# Patient Record
Sex: Female | Born: 2000 | Hispanic: Yes | Marital: Married | Smoking: Never smoker
Health system: Southern US, Community
[De-identification: ages and names within clinical notes are randomized; demographics above are authoritative.]

## PROBLEM LIST (undated history)

## (undated) DIAGNOSIS — D649 Anemia, unspecified: Secondary | ICD-10-CM

## (undated) DIAGNOSIS — R519 Headache, unspecified: Secondary | ICD-10-CM

## (undated) DIAGNOSIS — F419 Anxiety disorder, unspecified: Secondary | ICD-10-CM

## (undated) HISTORY — DX: Anxiety disorder, unspecified: F41.9

## (undated) HISTORY — DX: Headache, unspecified: R51.9

---
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
## 2018-01-02 ENCOUNTER — Ambulatory Visit: Payer: Medicaid Other

## 2018-01-02 ENCOUNTER — Ambulatory Visit: Payer: Self-pay

## 2018-01-04 ENCOUNTER — Ambulatory Visit: Payer: Self-pay

## 2018-01-04 ENCOUNTER — Ambulatory Visit: Payer: Medicaid Other | Attending: Diagnostic Neuroimaging

## 2018-01-04 DIAGNOSIS — M545 Low back pain: Secondary | ICD-10-CM | POA: Diagnosis present

## 2018-01-04 DIAGNOSIS — G8929 Other chronic pain: Secondary | ICD-10-CM | POA: Diagnosis present

## 2018-01-04 DIAGNOSIS — M62830 Muscle spasm of back: Secondary | ICD-10-CM | POA: Diagnosis present

## 2018-01-04 NOTE — Patient Instructions (Signed)
Issued cat /camel, prayer stretch also with side stretching , 30-45 sec 2-3x/day 2-3 reps, clams x 10-20reps   2x/day

## 2018-01-04 NOTE — Therapy (Addendum)
[place], [place], [address] Phone: [phone]   Fax:  [phone]  Physical Therapy Treatment/ Discharge   Patient Details  Name: Deborah Hunter MRN: [id number] Date of Birth: Dec 02, 2000 Referring Provider: DFr Andrey Spearman    Encounter Date: 01/04/2018  PT End of Session - 01/04/18 0710    Visit Number  2    Number of Visits  16    Date for PT Re-Evaluation  02/12/18    Authorization Type  Mediciad     Authorization Time Period  ends 02/25/18    Authorization - Number of Visits  16    PT Start Time  0710 late 10 min    PT Stop Time  0750    PT Time Calculation (min)  40 min    Behavior During Therapy  [facility] for tasks assessed/performed       Past Medical History:  Diagnosis Date  . Anemia   . Anxiety     No past surgical history on file.  There were no vitals filed for this visit.  Subjective Assessment - 01/04/18 0711    Subjective  No better since eval.  She reports HEP 2x/day.      Patient Stated Goals  decrease pain for longer periods of time    Currently in Pain?  Yes    Pain Score  7     Pain Location  Back    Pain Orientation  Mid    Pain Descriptors / Indicators  Aching    Pain Type  Chronic pain    Pain Onset  More than a month ago    Pain Frequency  Constant    Aggravating Factors   worse later in  day    Pain Relieving Factors  stretch    Multiple Pain Sites  No                      [facility] Adult PT Treatment/Exercise - 01/04/18 0001      Lumbar Exercises: Stretches   Active Hamstring Stretch  Right;Left;1 rep;30 seconds    Active Hamstring Stretch Limitations  with strap supine    Other Lumbar Stretch Exercise  piriformis stretchx 2 45 sec      Lumbar Exercises: Supine   Pelvic Tilt Limitations  10 reps 5 sec hold    Bent Knee Raise Limitations  supine bent knee raise 2x10     Bridge  10 reps      Lumbar Exercises: Sidelying   Clam  Right;Left     Clam Limitations  12 reps each      Lumbar Exercises: Quadruped   Madcat/Old Horse  10 reps    Madcat/Old Horse Limitations  followed by prayer stretch x 30 sec and RT/LT Ql stretch       Modalities   Modalities  Moist Heat      Moist Heat Therapy   Number Minutes Moist Heat  -- during session    Moist Heat Location  Lumbar Spine      Manual Therapy   Manual Therapy  Joint mobilization;Soft tissue mobilization    Joint Mobilization  Gr 2-3 to T5-L5     Soft tissue mobilization  to paraspinals an dQL             PT Education - 01/04/18 0750    Education provided  Yes    Education Details  HEP also opiton to hold pole and flex  to get  stretch /traction    Person(s) Educated  Patient    Methods  Explanation;Demonstration;Tactile cues;Verbal cues;Handout    Comprehension  Verbalized understanding;Returned demonstration       PT Short Term Goals - 12/18/17 1100      PT SHORT TERM GOAL #1   Title  Patient will demostrate a good core contraction     Baseline  moderate cuing for core contraction     Time  4    Status  New    Target Date  01/15/18      PT SHORT TERM GOAL #2   Title  Patient will increase gross bilateral lower extremity strength to 4+/5     Baseline  4/5 bilateral hip abduction and flexion     Time  4    Period  Weeks    Status  New    Target Date  01/15/18      PT SHORT TERM GOAL #3   Title  Patient will be independent with HEP     Baseline  no HEP     Time  4    Period  Weeks    Status  New    Target Date  01/15/18        PT Long Term Goals - 12/18/17 1103      PT LONG TERM GOAL #1   Title  Patient will squat down to pick up item without pain in order to perfrom work tasks     Baseline  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     Time  8    Period  Weeks    Status  New    Target Date  02/12/18      PT LONG TERM GOAL #2   Title  Patient will report 2/10 pain at worst when standing in order to perfrom ADl's      Baseline  Pain when standing more then 10-15 minutes     Time  8    Period  Weeks    Status  New    Target Date  02/12/18      PT LONG TERM GOAL #3   Title  Patient will sit for 1 hour without pain     Baseline  Can not sit more then 10-25 minutes without pain     Time  8    Period  Weeks    Status  New    Target Date  02/12/18            Plan - 01/04/18 0711    Clinical Impression Statement  She reported feeling better post session and agreed to HEP.   She was able to do all HEp correctly today.   No overall improvement    PT Treatment/Interventions  ADLs/Self Care Home Management;Cryotherapy;Electrical Stimulation;Iontophoresis 16m/ml Dexamethasone;Ultrasound;Therapeutic exercise;Therapeutic activities;Neuromuscular re-education;Patient/family education;Manual techniques;Taping;Passive range of motion    PT Next Visit Plan  manual therapy; review HEP; consdier adding bridges; maybe alt ue/LE if she can tolerate; hip abduction/ adduction    PT Home Exercise Plan  seated hamstring stretch; posterior pelvic tilt; tennis ball trigger posint release    Consulted and Agree with Plan of Care  Patient       Patient will benefit from skilled therapeutic intervention in order to improve the following deficits and impairments:  Pain, Increased muscle spasms, Decreased strength, Decreased range of motion, Decreased activity tolerance, Decreased endurance, Postural dysfunction  Visit Diagnosis: Chronic bilateral low back pain without sciatica  Muscle spasm  of back  PHYSICAL THERAPY DISCHARGE SUMMARY  Visits from Start of Care: 2 Current functional level related to goals / functional outcomes: Unknown    Remaining deficits: Unknown    Education / Equipment:HEP   Plan: Patient agrees to discharge.  Patient goals were not met. Patient is being discharged due to meeting the stated rehab goals.  ?????        Problem List There are no active problems to display for this  patient.  Burnis Medin PT DPT 03/19/2018  Darrel Hoover  PT 01/04/2018, 7:53 AM  [facility] [address] [place], [place], [address] Phone: [phone]   Fax:  [phone]  Name: Darline Faith MRN: [id number] Date of Birth: 07-Jun-2001

## 2018-01-09 ENCOUNTER — Ambulatory Visit: Payer: Self-pay

## 2018-01-09 ENCOUNTER — Ambulatory Visit: Payer: Medicaid Other

## 2018-01-11 ENCOUNTER — Ambulatory Visit: Payer: Medicaid Other

## 2018-01-11 ENCOUNTER — Ambulatory Visit: Payer: Self-pay

## 2018-01-15 ENCOUNTER — Ambulatory Visit: Payer: Self-pay

## 2018-01-15 ENCOUNTER — Ambulatory Visit: Payer: Medicaid Other

## 2018-01-16 ENCOUNTER — Telehealth: Payer: Self-pay | Admitting: Physical Therapy

## 2018-01-16 ENCOUNTER — Ambulatory Visit: Payer: Self-pay

## 2018-01-16 ENCOUNTER — Ambulatory Visit: Payer: Medicaid Other

## 2018-01-16 NOTE — Telephone Encounter (Signed)
Message left about missed appointments for next week and she needed to call to confirm or cancel these appointments

## 2018-01-23 ENCOUNTER — Ambulatory Visit: Payer: Self-pay

## 2018-01-25 ENCOUNTER — Ambulatory Visit: Payer: Self-pay

## 2018-02-12 ENCOUNTER — Ambulatory Visit: Payer: Medicaid Other | Admitting: Adult Health

## 2018-02-26 ENCOUNTER — Ambulatory Visit: Payer: Medicaid Other

## 2018-03-19 ENCOUNTER — Encounter: Payer: Self-pay | Admitting: Physical Therapy
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
## 2019-04-15 ENCOUNTER — Telehealth: Payer: Self-pay | Admitting: Diagnostic Neuroimaging

## 2019-04-15 NOTE — Telephone Encounter (Signed)
Pt consented to a virtual visit. Link sent to email in chart.  Pt understands that although there may be some limitations with this type of visit, we will take all precautions to reduce any security or privacy concerns.  Pt understands that this will be treated like an in office visit and we will file with pt's insurance, and there may be a patient responsible charge related to this service.

## 2019-04-18 ENCOUNTER — Encounter: Payer: Self-pay | Admitting: Diagnostic Neuroimaging

## 2019-04-18 NOTE — Telephone Encounter (Signed)
Spoke with patient and updated EMR. 

## 2019-04-22 ENCOUNTER — Other Ambulatory Visit: Payer: Self-pay

## 2019-04-22 ENCOUNTER — Encounter: Payer: Self-pay | Admitting: Diagnostic Neuroimaging

## 2019-04-22 ENCOUNTER — Ambulatory Visit (INDEPENDENT_AMBULATORY_CARE_PROVIDER_SITE_OTHER): Payer: Self-pay | Admitting: Diagnostic Neuroimaging

## 2019-04-22 DIAGNOSIS — G43109 Migraine with aura, not intractable, without status migrainosus: Secondary | ICD-10-CM

## 2019-04-22 DIAGNOSIS — F0781 Postconcussional syndrome: Secondary | ICD-10-CM

## 2019-04-22 MED ORDER — AMITRIPTYLINE HCL 10 MG PO TABS: 10.0000 mg | ORAL_TABLET | Freq: Every day | ORAL | 3 refills | Status: DC
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
## 2019-05-07 ENCOUNTER — Telehealth: Payer: Medicaid Other | Admitting: Family

## 2019-05-07 DIAGNOSIS — J069 Acute upper respiratory infection, unspecified: Secondary | ICD-10-CM | POA: Diagnosis not present

## 2019-05-07 MED ORDER — FLUTICASONE PROPIONATE 50 MCG/ACT NA SUSP: 2.0000 | Freq: Every day | NASAL | 0 refills | Status: AC

## 2019-05-07 MED ORDER — BENZONATATE 200 MG PO CAPS: 200.0000 mg | ORAL_CAPSULE | Freq: Two times a day (BID) | ORAL | 0 refills | Status: AC | PRN

## 2019-05-07 NOTE — Progress Notes (Signed)
We are sorry you are not feeling well.  Here is how we plan to help!  Based on what you have shared with me, it looks like you may have a viral upper respiratory infection.  Upper respiratory infections are caused by a large number of viruses; however, rhinovirus is the most common cause.   Symptoms vary from person to person, with common symptoms including sore throat, cough, and fatigue or lack of energy.  A low-grade fever of up to 100.4 may present, but is often uncommon.  Symptoms vary however, and are closely related to a person's age or underlying illnesses.  The most common symptoms associated with an upper respiratory infection are nasal discharge or congestion, cough, sneezing, headache and pressure in the ears and face.  These symptoms usually persist for about 3 to 10 days, but can last up to 2 weeks.  It is important to know that upper respiratory infections do not cause serious illness or complications in most cases.    Upper respiratory infections can be transmitted from person to person, with the most common method of transmission being a person's hands.  The virus is able to live on the skin and can infect other persons for up to 2 hours after direct contact.  Also, these can be transmitted when someone coughs or sneezes; thus, it is important to cover the mouth to reduce this risk.  To keep the spread of the illness at bay, good hand hygiene is very important.  This is an infection that is most likely caused by a virus. There are no specific treatments other than to help you with the symptoms until the infection runs its course.  We are sorry you are not feeling well.  Here is how we plan to help!   For nasal congestion, you may use an oral decongestants such as Mucinex D or if you have glaucoma or high blood pressure use plain Mucinex.  Saline nasal spray or nasal drops can help and can safely be used as often as needed for congestion.  For your congestion, I have prescribed Fluticasone  nasal spray one spray in each nostril twice a day  If you do not have a history of heart disease, hypertension, diabetes or thyroid disease, prostate/bladder issues or glaucoma, you may also use Sudafed to treat nasal congestion.  It is highly recommended that you consult with a pharmacist or your primary care physician to ensure this medication is safe for you to take.     If you have a cough, you may use cough suppressants such as Delsym and Robitussin.  If you have glaucoma or high blood pressure, you can also use Coricidin HBP.   For cough I have prescribed for you A prescription cough medication called Tessalon Perles 100 mg. You may take 1-2 capsules every 8 hours as needed for cough  If you have a sore or scratchy throat, use a saltwater gargle-  to  teaspoon of salt dissolved in a 4-ounce to 8-ounce glass of warm water.  Gargle the solution for approximately 15-30 seconds and then spit.  It is important not to swallow the solution.  You can also use throat lozenges/cough drops and Chloraseptic spray to help with throat pain or discomfort.  Warm or cold liquids can also be helpful in relieving throat pain.  For headache, pain or general discomfort, you can use Ibuprofen or Tylenol as directed.   Some authorities believe that zinc sprays or the use of Echinacea may shorten the   course of your symptoms.   HOME CARE . Only take medications as instructed by your medical team. . Be sure to drink plenty of fluids. Water is fine as well as fruit juices, sodas and electrolyte beverages. You may want to stay away from caffeine or alcohol. If you are nauseated, try taking small sips of liquids. How do you know if you are getting enough fluid? Your urine should be a pale yellow or almost colorless. . Get rest. . Taking a steamy shower or using a humidifier may help nasal congestion and ease sore throat pain. You can place a towel over your head and breathe in the steam from hot water coming from a  faucet. . Using a saline nasal spray works much the same way. . Cough drops, hard candies and sore throat lozenges may ease your cough. . Avoid close contacts especially the very young and the elderly . Cover your mouth if you cough or sneeze . Always remember to wash your hands.   GET HELP RIGHT AWAY IF: . You develop worsening fever. . If your symptoms do not improve within 10 days . You develop yellow or green discharge from your nose over 3 days. . You have coughing fits . You develop a severe head ache or visual changes. . You develop shortness of breath, difficulty breathing or start having chest pain . Your symptoms persist after you have completed your treatment plan  MAKE SURE YOU   Understand these instructions.  Will watch your condition.  Will get help right away if you are not doing well or get worse.  Your e-visit answers were reviewed by a board certified advanced clinical practitioner to complete your personal care plan. Depending upon the condition, your plan could have included both over the counter or prescription medications. Please review your pharmacy choice. If there is a problem, you may call our nursing hot line at and have the prescription routed to another pharmacy. Your safety is important to us. If you have drug allergies check your prescription carefully.   You can use MyChart to ask questions about today's visit, request a non-urgent call back, or ask for a work or school excuse for 24 hours related to this e-Visit. If it has been greater than 24 hours you will need to follow up with your provider, or enter a new e-Visit to address those concerns. You will get an e-mail in the next two days asking about your experience.  I hope that your e-visit has been valuable and will speed your recovery. Thank you for using e-visits.    Greater than 5 minutes, yet less than 10 minutes of time have been spent researching, coordinating, and implementing care for this  patient today.  Thank you for the details you included in the comment boxes. Those details are very helpful in determining the best course of treatment for you and help us to provide the best care.   

## 2020-05-06 ENCOUNTER — Ambulatory Visit: Payer: Medicaid Other

## 2020-05-06 ENCOUNTER — Ambulatory Visit: Payer: Medicaid Other | Attending: Internal Medicine

## 2020-05-06 DIAGNOSIS — Z20822 Contact with and (suspected) exposure to covid-19: Secondary | ICD-10-CM | POA: Insufficient documentation

## 2020-05-07 LAB — SARS-COV-2, NAA 2 DAY TAT

## 2020-05-07 LAB — NOVEL CORONAVIRUS, NAA: SARS-CoV-2, NAA: NOT DETECTED

## 2020-05-25 ENCOUNTER — Ambulatory Visit: Payer: Medicaid Other | Attending: Internal Medicine

## 2020-05-25 DIAGNOSIS — Z23 Encounter for immunization: Secondary | ICD-10-CM

## 2020-05-25 NOTE — Progress Notes (Signed)
   Covid-19 Vaccination Clinic  Name:  Sharita Bienaime    MRN: [id number] DOB: 2001-01-27  05/25/2020  Ms. Boys was observed post Covid-19 immunization for 15 minutes without incident. She was provided with Vaccine Information Sheet and instruction to access the V-Safe system.   Ms. Hirt was instructed to call 911 with any severe reactions post vaccine: Marland Kitchen Difficulty breathing  . Swelling of face and throat  . A fast heartbeat  . A bad rash all over body  . Dizziness and weakness   Immunizations Administered    Name Date Dose VIS Date Route   Pfizer COVID-19 Vaccine 05/25/2020  4:28 PM 0.3 mL 12/25/2018 Intramuscular   Manufacturer: [employer], [employer]   Lot: [id number]   NDC: [address]-1

## 2020-06-30 ENCOUNTER — Ambulatory Visit: Payer: Medicaid Other

## 2020-07-07 ENCOUNTER — Ambulatory Visit: Payer: Medicaid Other | Attending: Internal Medicine

## 2020-07-07 DIAGNOSIS — Z23 Encounter for immunization: Secondary | ICD-10-CM

## 2020-07-07 NOTE — Progress Notes (Signed)
   Covid-19 Vaccination Clinic  Name:  Laquita Harlan    MRN: [id number] DOB: 2001-07-29  07/07/2020  Ms. Azar was observed post Covid-19 immunization for 15 minutes without incident. She was provided with Vaccine Information Sheet and instruction to access the V-Safe system.   Ms. Sinkler was instructed to call 911 with any severe reactions post vaccine: Marland Kitchen Difficulty breathing  . Swelling of face and throat  . A fast heartbeat  . A bad rash all over body  . Dizziness and weakness   Immunizations Administered    Name Date Dose VIS Date Route   Pfizer COVID-19 Vaccine 07/07/2020  3:48 PM 0.3 mL 12/25/2018 Intramuscular   Manufacturer: [employer], [employer]   Lot: [id number]   NDC: [id number]

## 2021-06-28 DIAGNOSIS — R87620 Atypical squamous cells of undetermined significance on cytologic smear of vagina (ASC-US): Secondary | ICD-10-CM | POA: Diagnosis not present

## 2021-06-28 DIAGNOSIS — Z01411 Encounter for gynecological examination (general) (routine) with abnormal findings: Secondary | ICD-10-CM | POA: Diagnosis not present

## 2021-06-28 DIAGNOSIS — N912 Amenorrhea, unspecified: Secondary | ICD-10-CM | POA: Diagnosis not present

## 2021-06-28 DIAGNOSIS — N941 Unspecified dyspareunia: Secondary | ICD-10-CM | POA: Diagnosis not present

## 2021-06-28 DIAGNOSIS — Z975 Presence of (intrauterine) contraceptive device: Secondary | ICD-10-CM | POA: Diagnosis not present

## 2021-06-28 DIAGNOSIS — Z0000 Encounter for general adult medical examination without abnormal findings: Secondary | ICD-10-CM | POA: Diagnosis not present

## 2021-06-28 DIAGNOSIS — Z1272 Encounter for screening for malignant neoplasm of vagina: Secondary | ICD-10-CM | POA: Diagnosis not present

## 2021-07-08 ENCOUNTER — Telehealth (HOSPITAL_COMMUNITY): Payer: Self-pay | Admitting: Psychiatry

## 2021-07-08 ENCOUNTER — Other Ambulatory Visit: Payer: Self-pay

## 2021-07-08 ENCOUNTER — Ambulatory Visit (INDEPENDENT_AMBULATORY_CARE_PROVIDER_SITE_OTHER): Payer: No Payment, Other | Admitting: Psychiatry

## 2021-07-08 ENCOUNTER — Encounter (HOSPITAL_COMMUNITY): Payer: Self-pay | Admitting: Psychiatry

## 2021-07-08 DIAGNOSIS — F3181 Bipolar II disorder: Secondary | ICD-10-CM | POA: Diagnosis not present

## 2021-07-08 DIAGNOSIS — F411 Generalized anxiety disorder: Secondary | ICD-10-CM | POA: Diagnosis not present

## 2021-07-08 MED ORDER — ARIPIPRAZOLE 5 MG PO TABS
5.0000 mg | ORAL_TABLET | Freq: Every day | ORAL | 3 refills | Status: DC
  Filled 2021-07-08: qty 30, 30d supply, fill #0

## 2021-07-08 MED ORDER — SERTRALINE HCL 25 MG PO TABS
25.0000 mg | ORAL_TABLET | Freq: Every day | ORAL | 3 refills | Status: DC
  Filled 2021-07-08: qty 30, 30d supply, fill #0

## 2021-07-08 NOTE — Telephone Encounter (Signed)
Thank you for returning this call Ms. Beck.  If patient has further concerns she can call the clinic for instruction.
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
## 2021-07-08 NOTE — Telephone Encounter (Signed)
Patient asking if she needs to take medications prescribed today, with food or at night or any other instructions.  Call [phone]

## 2021-07-14 ENCOUNTER — Other Ambulatory Visit: Payer: Self-pay

## 2021-10-12 ENCOUNTER — Telehealth (INDEPENDENT_AMBULATORY_CARE_PROVIDER_SITE_OTHER): Payer: No Payment, Other | Admitting: Psychiatry

## 2021-10-12 ENCOUNTER — Other Ambulatory Visit: Payer: Self-pay

## 2021-10-12 ENCOUNTER — Encounter (HOSPITAL_COMMUNITY): Payer: Self-pay | Admitting: Psychiatry

## 2021-10-12 DIAGNOSIS — F3181 Bipolar II disorder: Secondary | ICD-10-CM

## 2021-10-12 DIAGNOSIS — F411 Generalized anxiety disorder: Secondary | ICD-10-CM

## 2021-10-12 MED ORDER — SERTRALINE HCL 25 MG PO TABS
25.0000 mg | ORAL_TABLET | Freq: Every day | ORAL | 3 refills | Status: AC
  Filled 2021-10-12: qty 30, 30d supply, fill #0

## 2021-10-12 MED ORDER — ARIPIPRAZOLE 5 MG PO TABS
5.0000 mg | ORAL_TABLET | Freq: Every day | ORAL | 3 refills | Status: AC
  Filled 2021-10-12: qty 30, 30d supply, fill #0

## 2021-10-12 NOTE — Progress Notes (Signed)
BH MD/PA/NP OP Progress Note Virtual Visit via Telephone Note  I connected with Deborah Hunter on 10/12/21 at  4:00 PM EST by telephone and verified that I am speaking with the correct person using two identifiers.  Location: Patient: home Provider: Clinic   I discussed the limitations, risks, security and privacy concerns of performing an evaluation and management service by telephone and the availability of in person appointments. I also discussed with the patient that there may be a patient responsible charge related to this service. The patient expressed understanding and agreed to proceed.   I provided 30 minutes of non-face-to-face time during this encounter.  10/12/2021 10:57 AM Deborah Hunter  MRN:  [id number]  Chief Complaint: "I never started the medication but I have been having a depressive episodes"  HPI: 20 year old female seen today for follow up psychiatric evaluation.   She has a psychiatric history of Bipolar 2, PTSD, anxiety, and depression.  Currently she is managed on Abilify 5 mg daily and Zoloft 25 mg daily. She notes she never started her medications due to fear of side effects.   Today patient was unable to logon virtually so assessment was done over the phone.  During exam she was pleasant, cooperative, and engaged in conversation.  She informed Clinical research associate that since her last visit she has been having a depressive episode. She notes that this episode has been going on for a month. Patient notes that she is irritable, distractible, and reports having racing thoughts. Patient also notes that her sleep has been reduced noting that she sleeps (4-6 hours). She denies other symptoms of mania.   Patient also notes that she is anxious most days. She reports that she worries about her finances, work, her family, and how people perceive her. A times she notes that she is paranoid.  Today provider conducted a GAD-7 and patient scored a 16, at her last visit patient  scored a 16. Provider also conducted a PHQ-9 and patient scored a 21, at her last visit she score a 16. She endorses passive SI but denies wanting to harm herself today. She denies SI/HI.    She continues to attends [employer] and studies to [employer]. 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   Today she is agreeable to starting Abilify 5 mg to help with his mood.  She is also agreeable to starting Zoloft 25 mg to help with his anxiety and depression. Potential side effects of medication and risks vs benefits of treatment vs non-treatment were explained and discussed. All questions were answered.  She will follow-up with outpatient counseling for therapy.  No other concerns noted at this time. Visit Diagnosis:    ICD-10-CM   1. Bipolar 2 disorder, major depressive episode (HCC)  F31.81 ARIPiprazole (ABILIFY) 5 MG tablet    sertraline (ZOLOFT) 25 MG tablet    2. Generalized anxiety disorder  F41.1 sertraline (ZOLOFT) 25 MG tablet      Past Psychiatric History: Bipolar 2, PTSD, anxiety, and depression  Past Medical History:  Past Medical History:  Diagnosis Date   Anemia    Anxiety    Headache    History reviewed. No pertinent surgical history.  Family Psychiatric History:  Mother depression   Family History:  Family History  Problem Relation Age of Onset   High Cholesterol Father    Hypertension Maternal Grandmother    Diabetes Paternal Grandmother    Diabetes Paternal Grandfather  Social History:  Social History   Socioeconomic History   Marital status: Married    Spouse name: Not on file   Number of children: Not on file   Years of education: Not on file   Highest education level: Not on file  Occupational History   Not on file  Tobacco Use   Smoking status: Never   Smokeless tobacco: Never  Substance and Sexual Activity   Alcohol use: No   Drug use: No   Sexual activity: Not on file  Other Topics Concern    Not on file  Social History Narrative   Lives with parents and siblings.  Works at a daycare center.  Education HS grad.  No children.  Caffeine 2 sodas on weekends.    Social Determinants of Health   Financial Resource Strain: Not on file  Food Insecurity: Not on file  Transportation Needs: Not on file  Physical Activity: Not on file  Stress: Not on file  Social Connections: Not on file    Allergies: No Known Allergies  Metabolic Disorder Labs: No results found for: HGBA1C, MPG No results found for: PROLACTIN No results found for: CHOL, TRIG, HDL, CHOLHDL, VLDL, LDLCALC No results found for: TSH  Therapeutic Level Labs: No results found for: LITHIUM No results found for: VALPROATE No components found for:  CBMZ  Current Medications: Current Outpatient Medications  Medication Sig Dispense Refill   ARIPiprazole (ABILIFY) 5 MG tablet Take 1 tablet (5 mg total) by mouth daily. 30 tablet 3   benzonatate (TESSALON) 200 MG capsule Take 1 capsule (200 mg total) by mouth 2 (two) times daily as needed for cough. 20 capsule 0   fluticasone (FLONASE) 50 MCG/ACT nasal spray Place 2 sprays into both nostrils daily. 16 g 0   sertraline (ZOLOFT) 25 MG tablet Take 1 tablet (25 mg total) by mouth daily. 30 tablet 3   No current facility-administered medications for this visit.     Musculoskeletal: Strength & Muscle Tone:  Unable to assess due to telephone visit Gait & Station:  Unable to assess due to telephone visit Patient leans: N/A  Psychiatric Specialty Exam: Review of Systems  There were no vitals taken for this visit.There is no height or weight on file to calculate BMI.  General Appearance:  Unable to assess due to telephone visit  Eye Contact:   Unable to assess due to telephone visit  Speech:  Clear and Coherent and Normal Rate  Volume:  Normal  Mood:  Anxious and Depressed  Affect:  Appropriate and Congruent  Thought Process:  Coherent, Goal Directed, and Linear   Orientation:  Full (Time, Place, and Person)  Thought Content: WDL and Logical   Suicidal Thoughts:  Yes.  without intent/plan  Homicidal Thoughts:  No  Memory:  Immediate;   Good Recent;   Good Remote;   Good  Judgement:  Good  Insight:  Good  Psychomotor Activity:  Normal  Concentration:  Concentration: Good and Attention Span: Good  Recall:  Good  Fund of Knowledge: Good  Language: Good  Akathisia:   Unable to assess due to telephone visit  Handed:  Right  AIMS (if indicated): not done  Assets:  Communication Skills Desire for Improvement Financial Resources/Insurance Housing Intimacy Physical Health  ADL's:  Intact  Cognition: WNL  Sleep:  Fair   Screenings: GAD-7    Flowsheet Row Video Visit from 10/12/2021 in [facility] Office Visit from 07/08/2021 in [facility]  Total GAD-7 Score 16 16      PHQ2-9    Flowsheet Row Video Visit from 10/12/2021 in [facility] Office Visit from 07/08/2021 in [facility]  PHQ-2 Total Score 5 5  PHQ-9 Total Score 21 16      Flowsheet Row Video Visit from 10/12/2021 in [facility] Office Visit from 07/08/2021 in [facility]  C-SSRS RISK CATEGORY Error: Q7 should not be populated when Q6 is No Error: Q7 should not be populated when Q6 is No        Assessment and Plan: Patient endorses symptoms of anxiety, depression, and poor sleep. She notes that she has not started Abilify and Zoloft but is agreeable to starting them today.  1. Bipolar 2 disorder, major depressive episode (HCC)  Start- ARIPiprazole (ABILIFY) 5 MG tablet; Take 1 tablet (5 mg total) by mouth daily.  Dispense: 30 tablet; Refill: 3 Start- sertraline (ZOLOFT) 25 MG tablet; Take 1 tablet (25 mg total) by mouth daily.  Dispense: 30 tablet; Refill: 3  2. Generalized anxiety disorder  Start-  sertraline (ZOLOFT) 25 MG tablet; Take 1 tablet (25 mg total) by mouth daily.  Dispense: 30 tablet; Refill: 3  Follow up in 3 months  Shanna Cisco, NP 10/12/2021, 10:57 AM

## 2021-10-19 ENCOUNTER — Other Ambulatory Visit: Payer: Self-pay

## 2022-01-04 ENCOUNTER — Telehealth (HOSPITAL_COMMUNITY): Payer: No Payment, Other | Admitting: Psychiatry

## 2022-09-30 DIAGNOSIS — Z419 Encounter for procedure for purposes other than remedying health state, unspecified: Secondary | ICD-10-CM | POA: Diagnosis not present

## 2022-10-31 DIAGNOSIS — Z419 Encounter for procedure for purposes other than remedying health state, unspecified: Secondary | ICD-10-CM | POA: Diagnosis not present

## 2022-12-01 DIAGNOSIS — Z419 Encounter for procedure for purposes other than remedying health state, unspecified: Secondary | ICD-10-CM | POA: Diagnosis not present

## 2022-12-02 ENCOUNTER — Other Ambulatory Visit: Payer: Medicaid Other

## 2022-12-12 ENCOUNTER — Telehealth: Payer: Self-pay

## 2022-12-12 NOTE — Telephone Encounter (Signed)
Mychart msg sent. AS, CMA

## 2022-12-30 DIAGNOSIS — Z419 Encounter for procedure for purposes other than remedying health state, unspecified: Secondary | ICD-10-CM | POA: Diagnosis not present

## 2023-01-30 DIAGNOSIS — Z419 Encounter for procedure for purposes other than remedying health state, unspecified: Secondary | ICD-10-CM | POA: Diagnosis not present
